# Patient Record
Sex: Female | Born: 1955 | Race: White | Hispanic: No | Marital: Married | Smoking: Never smoker
Health system: Southern US, Community
[De-identification: ages and names within clinical notes are randomized; demographics above are authoritative.]

## PROBLEM LIST (undated history)

## (undated) DIAGNOSIS — D649 Anemia, unspecified: Secondary | ICD-10-CM

## (undated) DIAGNOSIS — C4491 Basal cell carcinoma of skin, unspecified: Secondary | ICD-10-CM

## (undated) HISTORY — DX: Anemia, unspecified: D64.9

## (undated) HISTORY — DX: Basal cell carcinoma of skin, unspecified: C44.91

---

## 2004-09-19 ENCOUNTER — Ambulatory Visit: Payer: Self-pay | Admitting: Unknown Physician Specialty

## 2005-10-03 ENCOUNTER — Ambulatory Visit: Payer: Self-pay | Admitting: Unknown Physician Specialty

## 2006-01-23 ENCOUNTER — Ambulatory Visit: Payer: Self-pay | Admitting: Gastroenterology

## 2006-10-06 ENCOUNTER — Ambulatory Visit: Payer: Self-pay | Admitting: Unknown Physician Specialty

## 2007-10-08 ENCOUNTER — Ambulatory Visit: Payer: Self-pay | Admitting: Unknown Physician Specialty

## 2008-10-10 ENCOUNTER — Ambulatory Visit: Payer: Self-pay | Admitting: Unknown Physician Specialty

## 2009-10-16 ENCOUNTER — Ambulatory Visit: Payer: Self-pay | Admitting: Unknown Physician Specialty

## 2010-09-24 ENCOUNTER — Ambulatory Visit: Payer: Self-pay | Admitting: Unknown Physician Specialty

## 2010-10-24 ENCOUNTER — Ambulatory Visit: Payer: Self-pay | Admitting: Unknown Physician Specialty

## 2012-03-24 DIAGNOSIS — C4491 Basal cell carcinoma of skin, unspecified: Secondary | ICD-10-CM

## 2012-03-24 HISTORY — DX: Basal cell carcinoma of skin, unspecified: C44.91

## 2012-03-24 HISTORY — PX: BASAL CELL CARCINOMA EXCISION: SHX1214

## 2013-03-05 ENCOUNTER — Emergency Department: Payer: Self-pay | Admitting: Internal Medicine

## 2016-03-10 ENCOUNTER — Other Ambulatory Visit: Payer: Self-pay | Admitting: Obstetrics and Gynecology

## 2016-03-10 DIAGNOSIS — Z1231 Encounter for screening mammogram for malignant neoplasm of breast: Secondary | ICD-10-CM

## 2016-04-11 ENCOUNTER — Ambulatory Visit: Payer: Self-pay

## 2016-05-02 ENCOUNTER — Ambulatory Visit
Admission: RE | Admit: 2016-05-02 | Discharge: 2016-05-02 | Disposition: A | Discharge status: Home / Self Care | Payer: BC Managed Care – PPO | Source: Ambulatory Visit | Attending: Obstetrics and Gynecology | Admitting: Obstetrics and Gynecology

## 2016-05-02 ENCOUNTER — Encounter (HOSPITAL_COMMUNITY): Payer: Self-pay

## 2016-05-02 DIAGNOSIS — Z1231 Encounter for screening mammogram for malignant neoplasm of breast: Secondary | ICD-10-CM | POA: Insufficient documentation

## 2016-05-08 ENCOUNTER — Other Ambulatory Visit: Payer: Self-pay | Admitting: *Deleted

## 2016-05-08 ENCOUNTER — Inpatient Hospital Stay
Admission: RE | Admit: 2016-05-08 | Discharge: 2016-05-08 | Disposition: A | Discharge status: Home / Self Care | Payer: Self-pay | Source: Ambulatory Visit | Attending: *Deleted | Admitting: *Deleted

## 2016-05-08 DIAGNOSIS — Z9289 Personal history of other medical treatment: Secondary | ICD-10-CM

## 2017-03-11 ENCOUNTER — Ambulatory Visit (INDEPENDENT_AMBULATORY_CARE_PROVIDER_SITE_OTHER): Payer: BC Managed Care – PPO | Admitting: Obstetrics and Gynecology

## 2017-03-11 ENCOUNTER — Encounter: Payer: Self-pay | Admitting: Obstetrics and Gynecology

## 2017-03-11 VITALS — BP 120/80 | HR 57 | Ht 67.5 in | Wt 143.0 lb

## 2017-03-11 DIAGNOSIS — Z01419 Encounter for gynecological examination (general) (routine) without abnormal findings: Secondary | ICD-10-CM | POA: Diagnosis not present

## 2017-03-11 DIAGNOSIS — Z1231 Encounter for screening mammogram for malignant neoplasm of breast: Secondary | ICD-10-CM

## 2017-03-11 DIAGNOSIS — Z1239 Encounter for other screening for malignant neoplasm of breast: Secondary | ICD-10-CM

## 2017-03-11 NOTE — Patient Instructions (Addendum)
I value your feedback and entrusting us with your care. If you get a  patient survey, I would appreciate you taking the time to let us know about your experience today. Thank you!  [facility]: [phone]    

## 2017-03-11 NOTE — Progress Notes (Signed)
PCP: Patient, No Pcp Per   Chief Complaint  Patient presents with  . Gynecologic Exam    HPI:      [name] is a 61 y.o. No obstetric history on file. who LMP was No LMP recorded. Patient is postmenopausal., presents today for her annual examination.  Her menses are absent due to menopause. She does not have intermenstrual bleeding.  She does not have vasomotor sx.  Sex activity: single partner, contraception - post menopausal status. She does have vaginal dryness and uses lubricants with sx relief.  Last Pap: February 09, 2015  Results were: no abnormalities /neg HPV DNA.  Hx of STDs: none  Last mammogram: May 02, 2016  Results were: normal--routine follow-up in 12 months There is no FH of breast cancer. There is no FH of ovarian cancer. The patient does do self-breast exams.  Colonoscopy: colonoscopy 1 years ago with abnormalities. . Repeat due after 5 years.   Tobacco use: The patient denies current or previous tobacco use. Alcohol use: none Exercise: moderately active  She does get adequate calcium and Vitamin D in her diet.  Labs with PCP.  Past Medical History:  Diagnosis Date  . Basal cell carcinoma 2014   scalp    Past Surgical History:  Procedure Laterality Date  . BASAL CELL CARCINOMA EXCISION  2014   scalp    Family History  Problem Relation Age of Onset  . Breast cancer Neg Hx     Social History   Socioeconomic History  . Marital status: Married    Spouse name: Not on file  . Number of children: Not on file  . Years of education: Not on file  . Highest education level: Not on file  Social Needs  . Financial resource strain: Not on file  . Food insecurity - worry: Not on file  . Food insecurity - inability: Not on file  . Transportation needs - medical: Not on file  . Transportation needs - non-medical: Not on file  Occupational History  . Not on file  Tobacco Use  . Smoking status: Never Smoker  . Smokeless tobacco: Never  Used  Substance and Sexual Activity  . Alcohol use: No    Frequency: Never  . Drug use: No  . Sexual activity: Yes  Other Topics Concern  . Not on file  Social History Narrative  . Not on file    No outpatient medications have been marked as taking for the 03/11/17 encounter (Office Visit) with Vonette Grosso, Deirdre Evener, PA-C.      ROS:  Review of Systems  Constitutional: Negative for fatigue, fever and unexpected weight change.  Respiratory: Positive for cough. Negative for shortness of breath and wheezing.   Cardiovascular: Negative for chest pain, palpitations and leg swelling.  Gastrointestinal: Negative for blood in stool, constipation, diarrhea, nausea and vomiting.  Endocrine: Negative for cold intolerance, heat intolerance and polyuria.  Genitourinary: Negative for dyspareunia, dysuria, flank pain, frequency, genital sores, hematuria, menstrual problem, pelvic pain, urgency, vaginal bleeding, vaginal discharge and vaginal pain.  Musculoskeletal: Negative for back pain, joint swelling and myalgias.  Skin: Negative for rash.  Neurological: Negative for dizziness, syncope, light-headedness, numbness and headaches.  Hematological: Negative for adenopathy.  Psychiatric/Behavioral: Negative for agitation, confusion, sleep disturbance and suicidal ideas. The patient is not nervous/anxious.      Objective: BP 120/80   Pulse (!) 57   Ht 5' 7.5" (1.715 m)   Wt 143 lb (64.9 kg)   BMI  22.07 kg/m    Physical Exam  Constitutional: She is oriented to person, place, and time. She appears well-developed and well-nourished.  Genitourinary: Vagina normal and uterus normal. There is no rash or tenderness on the right labia. There is no rash or tenderness on the left labia. No erythema or tenderness in the vagina. No vaginal discharge found. Right adnexum does not display mass and does not display tenderness. Left adnexum does not display mass and does not display tenderness. Cervix does not  exhibit motion tenderness or polyp. Uterus is not enlarged or tender.  Neck: Normal range of motion. No thyromegaly present.  Cardiovascular: Normal rate, regular rhythm and normal heart sounds.  No murmur heard. Pulmonary/Chest: Effort normal and breath sounds normal. Right breast exhibits no mass, no nipple discharge, no skin change and no tenderness. Left breast exhibits no mass, no nipple discharge, no skin change and no tenderness.  Abdominal: Soft. There is no tenderness. There is no guarding.  Musculoskeletal: Normal range of motion.  Neurological: She is alert and oriented to person, place, and time. No cranial nerve deficit.  Psychiatric: She has a normal mood and affect. Her behavior is normal.  Vitals reviewed.   Assessment/Plan:  Encounter for annual routine gynecological examination  Screening for breast cancer - Pt to sched mammo. - Plan: MM DIGITAL SCREENING BILATERAL          GYN counsel breast self exam, mammography screening, menopause, adequate intake of calcium and vitamin D, diet and exercise    F/U  Return in about 1 year (around 03/11/2018).  Orlandria Kissner B. Tylicia Sherman, PA-C 03/11/2017 9:24 AM

## 2017-05-05 ENCOUNTER — Ambulatory Visit
Admission: RE | Admit: 2017-05-05 | Discharge: 2017-05-05 | Disposition: A | Discharge status: Home / Self Care | Payer: BC Managed Care – PPO | Source: Ambulatory Visit | Attending: Obstetrics and Gynecology | Admitting: Obstetrics and Gynecology

## 2017-05-05 ENCOUNTER — Encounter: Payer: Self-pay | Admitting: Obstetrics and Gynecology

## 2017-05-05 DIAGNOSIS — Z1239 Encounter for other screening for malignant neoplasm of breast: Secondary | ICD-10-CM

## 2017-05-05 DIAGNOSIS — Z1231 Encounter for screening mammogram for malignant neoplasm of breast: Secondary | ICD-10-CM | POA: Insufficient documentation

## 2018-03-29 NOTE — Patient Instructions (Addendum)
I value your feedback and entrusting us with your care. If you get a [facility] patient survey, I would appreciate you taking the time to let us know about your experience today. Thank you!  [facility]: [phone]    

## 2018-03-29 NOTE — Progress Notes (Signed)
PCP: Patient, No Pcp Per   Chief Complaint  Patient presents with  . Gynecologic Exam    HPI:      [name] is a 63 y.o. No obstetric history on file. who LMP was No LMP recorded. Patient is postmenopausal., presents today for her annual examination.  Her menses are absent due to menopause. She does not have intermenstrual bleeding.  She does not have vasomotor sx.  Sex activity: single partner, contraception - post menopausal status. She does have vaginal dryness and uses lubricants with sx relief.  Last Pap: February 09, 2015  Results were: no abnormalities /neg HPV DNA.  Hx of STDs: none  Last mammogram: 05/05/17  Results were: normal--routine follow-up in 12 months There is no FH of breast cancer. There is no FH of ovarian cancer. The patient does do self-breast exams.  Colonoscopy: colonoscopy 2 years ago with abnormalities.  Repeat due after 5 years. FH colon cancer but doesn't qualify for cancer genetic testing.   Tobacco use: The patient denies current or previous tobacco use. Alcohol use: none Exercise: moderately active  She does get adequate calcium and Vitamin D in her diet.  Labs with PCP.  Past Medical History:  Diagnosis Date  . Anemia   . Basal cell carcinoma 2014   scalp    Past Surgical History:  Procedure Laterality Date  . BASAL CELL CARCINOMA EXCISION  2014   scalp  . CESAREAN SECTION  08/20/1983    Family History  Problem Relation Age of Onset  . Skin cancer Mother   . Leukemia Father   . Colon cancer Maternal Grandfather        "older"  . Colon cancer Maternal Aunt        92s  . Breast cancer Neg Hx     Social History   Socioeconomic History  . Marital status: Married    Spouse name: Not on file  . Number of children: Not on file  . Years of education: Not on file  . Highest education level: Not on file  Occupational History  . Not on file  Social Needs  . Financial resource strain: Not on file  . Food insecurity:     Worry: Not on file    Inability: Not on file  . Transportation needs:    Medical: Not on file    Non-medical: Not on file  Tobacco Use  . Smoking status: Never Smoker  . Smokeless tobacco: Never Used  Substance and Sexual Activity  . Alcohol use: No    Frequency: Never  . Drug use: No  . Sexual activity: Yes    Birth control/protection: Post-menopausal  Lifestyle  . Physical activity:    Days per week: Not on file    Minutes per session: Not on file  . Stress: Not on file  Relationships  . Social connections:    Talks on phone: Not on file    Gets together: Not on file    Attends religious service: Not on file    Active member of club or organization: Not on file    Attends meetings of clubs or organizations: Not on file    Relationship status: Not on file  . Intimate partner violence:    Fear of current or ex partner: Not on file    Emotionally abused: Not on file    Physically abused: Not on file    Forced sexual activity: Not on file  Other Topics Concern  .  Not on file  Social History Narrative  . Not on file    Current Meds  Medication Sig  . calcium carbonate (CALCIUM 600) 600 MG TABS tablet Take by mouth.  Marland Kitchen ibuprofen (ADVIL,MOTRIN) 200 MG tablet Take by mouth.  . Omega-3 Fatty Acids (FISH OIL) 1200 MG CAPS Take by mouth.      ROS:  Review of Systems  Constitutional: Negative for fatigue, fever and unexpected weight change.  Respiratory: Negative for cough, shortness of breath and wheezing.   Cardiovascular: Negative for chest pain, palpitations and leg swelling.  Gastrointestinal: Negative for blood in stool, constipation, diarrhea, nausea and vomiting.  Endocrine: Negative for cold intolerance, heat intolerance and polyuria.  Genitourinary: Negative for dyspareunia, dysuria, flank pain, frequency, genital sores, hematuria, menstrual problem, pelvic pain, urgency, vaginal bleeding, vaginal discharge and vaginal pain.  Musculoskeletal: Negative for  back pain, joint swelling and myalgias.  Skin: Negative for rash.  Neurological: Positive for numbness. Negative for dizziness, syncope, light-headedness and headaches.  Hematological: Negative for adenopathy.  Psychiatric/Behavioral: Negative for agitation, confusion, sleep disturbance and suicidal ideas. The patient is not nervous/anxious.      Objective: BP 116/70   Pulse 61   Ht 5\' 7"  (1.702 m)   Wt 146 lb (66.2 kg)   BMI 22.87 kg/m    Physical Exam Constitutional:      Appearance: She is well-developed.  Genitourinary:     Vulva, vagina, uterus, right adnexa and left adnexa normal.     No vulval lesion or tenderness noted.     No vaginal discharge, erythema or tenderness.     No cervical motion tenderness or polyp.     Uterus is not enlarged or tender.     No right or left adnexal mass present.     Right adnexa not tender.     Left adnexa not tender.  Neck:     Musculoskeletal: Normal range of motion.     Thyroid: No thyromegaly.  Cardiovascular:     Rate and Rhythm: Normal rate and regular rhythm.     Heart sounds: Normal heart sounds. No murmur.  Pulmonary:     Effort: Pulmonary effort is normal.     Breath sounds: Normal breath sounds.  Chest:     Breasts:        Right: No mass, nipple discharge, skin change or tenderness.        Left: No mass, nipple discharge, skin change or tenderness.  Abdominal:     Palpations: Abdomen is soft.     Tenderness: There is no abdominal tenderness. There is no guarding.  Musculoskeletal: Normal range of motion.  Neurological:     Mental Status: She is alert and oriented to person, place, and time.     Cranial Nerves: No cranial nerve deficit.  Psychiatric:        Behavior: Behavior normal.  Vitals signs reviewed.     Assessment/Plan:  Encounter for annual routine gynecological examination  Cervical cancer screening - Plan: Cytology - PAP  Screening for HPV (human papillomavirus) - Plan: Cytology - PAP  Screening  for breast cancer - Pt to sched mammo - Plan: MM 3D SCREEN BREAST BILATERAL          GYN counsel breast self exam, mammography screening, menopause, adequate intake of calcium and vitamin D, diet and exercise    F/U  Return in about 1 year (around 03/31/2019).  Breanna Shorkey B. Deloros Beretta, PA-C 03/30/2018 8:46 AM

## 2018-03-30 ENCOUNTER — Encounter: Payer: Self-pay | Admitting: Obstetrics and Gynecology

## 2018-03-30 ENCOUNTER — Ambulatory Visit (INDEPENDENT_AMBULATORY_CARE_PROVIDER_SITE_OTHER): Payer: BC Managed Care – PPO | Admitting: Obstetrics and Gynecology

## 2018-03-30 ENCOUNTER — Other Ambulatory Visit (HOSPITAL_COMMUNITY)
Admission: RE | Admit: 2018-03-30 | Discharge: 2018-03-30 | Disposition: A | Discharge status: Home / Self Care | Payer: BC Managed Care – PPO | Source: Ambulatory Visit | Attending: Obstetrics and Gynecology | Admitting: Obstetrics and Gynecology

## 2018-03-30 VITALS — BP 116/70 | HR 61 | Ht 67.0 in | Wt 146.0 lb

## 2018-03-30 DIAGNOSIS — Z01419 Encounter for gynecological examination (general) (routine) without abnormal findings: Secondary | ICD-10-CM

## 2018-03-30 DIAGNOSIS — Z124 Encounter for screening for malignant neoplasm of cervix: Secondary | ICD-10-CM | POA: Insufficient documentation

## 2018-03-30 DIAGNOSIS — Z1151 Encounter for screening for human papillomavirus (HPV): Secondary | ICD-10-CM | POA: Insufficient documentation

## 2018-03-30 DIAGNOSIS — Z1239 Encounter for other screening for malignant neoplasm of breast: Secondary | ICD-10-CM

## 2018-03-31 LAB — CYTOLOGY - PAP
DIAGNOSIS: NEGATIVE
HPV: NOT DETECTED

## 2018-05-06 ENCOUNTER — Ambulatory Visit
Admission: RE | Admit: 2018-05-06 | Discharge: 2018-05-06 | Disposition: A | Discharge status: Home / Self Care | Payer: BC Managed Care – PPO | Source: Ambulatory Visit | Attending: Obstetrics and Gynecology | Admitting: Obstetrics and Gynecology

## 2018-05-06 ENCOUNTER — Encounter: Payer: Self-pay | Admitting: Obstetrics and Gynecology

## 2018-05-06 DIAGNOSIS — Z1239 Encounter for other screening for malignant neoplasm of breast: Secondary | ICD-10-CM

## 2018-05-06 DIAGNOSIS — Z1231 Encounter for screening mammogram for malignant neoplasm of breast: Secondary | ICD-10-CM | POA: Insufficient documentation

## 2018-09-21 NOTE — Patient Instructions (Signed)
I value your feedback and entrusting us with your care. If you get a [facility] patient survey, I would appreciate you taking the time to let us know about your experience today. Thank you! 

## 2018-09-21 NOTE — Progress Notes (Signed)
Copland, Deirdre Evener, PA-C   Chief Complaint  Patient presents with  . Breast Exam    sore to the touch on side of right breast    HPI:      Ms. FRONA YOST is a 63 y.o. G1P1001 who LMP was No LMP recorded. Patient is postmenopausal., presents today for RT breast pain since end of May after mild shoulder injury. Pt was pulling box down and it was heavier than she expected. It jarred her RT shoulder and since then she has had RT shoulder, neck, back, and lat pain/aches. Saw PCP, started on meloxicam with some improvement. Pt has full ROM of RT arm and denies weakness/numbness, but RT arm sometimes feels heavy. With this, she has noted RT OQ breast tenderness with palpation, no masses. Also s/p RT breast/axilla injury 12/19 after falling on chair that hit breast tissue. That injury resolved.  Just wants peace of mind that it's related to shoulder. Drinks caffeine daily. Pt is postmenopausal. Last mammo 2/20 was normal. No FH breast/ovar cancer.  Last annual 1/20.  Past Medical History:  Diagnosis Date  . Anemia   . Basal cell carcinoma 2014   scalp    Past Surgical History:  Procedure Laterality Date  . BASAL CELL CARCINOMA EXCISION  2014   scalp  . CESAREAN SECTION  08/20/1983    Family History  Problem Relation Age of Onset  . Skin cancer Mother   . Leukemia Father   . Colon cancer Maternal Grandfather        "older"  . Colon cancer Maternal Aunt        45s  . Breast cancer Neg Hx     Social History   Socioeconomic History  . Marital status: Married    Spouse name: Not on file  . Number of children: Not on file  . Years of education: Not on file  . Highest education level: Not on file  Occupational History  . Not on file  Social Needs  . Financial resource strain: Not on file  . Food insecurity    Worry: Not on file    Inability: Not on file  . Transportation needs    Medical: Not on file    Non-medical: Not on file  Tobacco Use  . Smoking status:  Never Smoker  . Smokeless tobacco: Never Used  Substance and Sexual Activity  . Alcohol use: No    Frequency: Never  . Drug use: No  . Sexual activity: Yes    Birth control/protection: Post-menopausal  Lifestyle  . Physical activity    Days per week: Not on file    Minutes per session: Not on file  . Stress: Not on file  Relationships  . Social Herbalist on phone: Not on file    Gets together: Not on file    Attends religious service: Not on file    Active member of club or organization: Not on file    Attends meetings of clubs or organizations: Not on file    Relationship status: Not on file  . Intimate partner violence    Fear of current or ex partner: Not on file    Emotionally abused: Not on file    Physically abused: Not on file    Forced sexual activity: Not on file  Other Topics Concern  . Not on file  Social History Narrative  . Not on file    Outpatient Medications Prior to Visit  Medication Sig Dispense Refill  . calcium carbonate (CALCIUM 600) 600 MG TABS tablet Take by mouth.    Marland Kitchen ibuprofen (ADVIL,MOTRIN) 200 MG tablet Take by mouth.    . Omega-3 Fatty Acids (FISH OIL) 1200 MG CAPS Take by mouth.    . meloxicam (MOBIC) 15 MG tablet Take by mouth.     No facility-administered medications prior to visit.       ROS:  Review of Systems  Constitutional: Negative for fatigue, fever and unexpected weight change.  Respiratory: Negative for cough, shortness of breath and wheezing.   Cardiovascular: Negative for chest pain, palpitations and leg swelling.  Gastrointestinal: Negative for blood in stool, constipation, diarrhea, nausea and vomiting.  Endocrine: Negative for cold intolerance, heat intolerance and polyuria.  Genitourinary: Negative for dyspareunia, dysuria, flank pain, frequency, genital sores, hematuria, menstrual problem, pelvic pain, urgency, vaginal bleeding, vaginal discharge and vaginal pain.  Musculoskeletal: Positive for  arthralgias. Negative for back pain, joint swelling and myalgias.  Skin: Negative for rash.  Neurological: Negative for dizziness, syncope, light-headedness, numbness and headaches.  Hematological: Negative for adenopathy.  Psychiatric/Behavioral: Negative for agitation, confusion, sleep disturbance and suicidal ideas. The patient is not nervous/anxious.    BREAST: PAIN   OBJECTIVE:   Vitals:  BP 140/80   Ht 5\' 7"  (1.702 m)   Wt 146 lb (66.2 kg)   BMI 22.87 kg/m   Physical Exam Vitals signs reviewed.  Neck:     Musculoskeletal: Normal range of motion.  Pulmonary:     Effort: Pulmonary effort is normal.  Chest:     Breasts: Breasts are symmetrical.        Right: No inverted nipple, mass, nipple discharge, skin change or tenderness.        Left: No inverted nipple, mass, nipple discharge, skin change or tenderness.    Musculoskeletal: Normal range of motion.  Skin:    General: Skin is warm and dry.  Neurological:     General: No focal deficit present.     Mental Status: She is alert and oriented to person, place, and time.     Cranial Nerves: No cranial nerve deficit.  Psychiatric:        Mood and Affect: Mood normal.        Behavior: Behavior normal.        Thought Content: Thought content normal.        Judgment: Judgment normal.     Assessment/Plan: Breast pain, right - Plan: Most likely pectoral muscle pain from shoulder injury. Reassurance. Cont SBE, d/c caffeine. F/u prn.   Injury of right shoulder, initial encounter - Plan: Cont exercise/stretch/NSAIDs/heat and ice. F/u prn.      Return if symptoms worsen or fail to improve.  Alicia B. Copland, PA-C 09/22/2018 9:52 AM

## 2018-09-22 ENCOUNTER — Encounter: Payer: Self-pay | Admitting: Obstetrics and Gynecology

## 2018-09-22 ENCOUNTER — Other Ambulatory Visit: Payer: Self-pay

## 2018-09-22 ENCOUNTER — Ambulatory Visit (INDEPENDENT_AMBULATORY_CARE_PROVIDER_SITE_OTHER): Payer: BC Managed Care – PPO | Admitting: Obstetrics and Gynecology

## 2018-09-22 VITALS — BP 140/80 | Ht 67.0 in | Wt 146.0 lb

## 2018-09-22 DIAGNOSIS — S4991XA Unspecified injury of right shoulder and upper arm, initial encounter: Secondary | ICD-10-CM

## 2018-09-22 DIAGNOSIS — N644 Mastodynia: Secondary | ICD-10-CM | POA: Diagnosis not present

## 2019-04-20 NOTE — Progress Notes (Signed)
PCP: Chad Cordial, PA-C   Chief Complaint  Patient presents with  . Gynecologic Exam    still having tenderness under right arm (side of right breast)    HPI:      [name] is a 64 y.o. No obstetric history on file. who LMP was No LMP recorded. Patient is postmenopausal., presents today for her annual examination.  Her menses are absent due to menopause. She does not have intermenstrual bleeding. She does not have vasomotor sx.   Sex activity: single partner, contraception - post menopausal status. She does have vaginal dryness and uses lubricants with sx relief.  Last Pap: 03/30/18  Results were: no abnormalities /neg HPV DNA.  Hx of STDs: none  Last mammogram: 05/06/18  Results were: normal--routine follow-up in 12 months There is no FH of breast cancer. There is no FH of ovarian cancer. The patient does do self-breast exams. She was seen 7/20 for ROQ breast and axilla pain after shoulder injury. Had also fallen 12/19 and hit side of breast. Pt was given meloxicam by PCP. Still having pain sx, no masses. Concerned about breast pathology. Has some pain with certain shoulder movements as well. PCP suggested PT.   Colonoscopy: colonoscopy 3 years ago with abnormalities.  Repeat due after 5 years. FH colon cancer but doesn't qualify for cancer genetic testing.   Tobacco use: The patient denies current or previous tobacco use. Alcohol use: none  No drug use Exercise: moderately active  She does get adequate calcium and Vitamin D in her diet.  Labs with PCP.  Past Medical History:  Diagnosis Date  . Anemia   . Basal cell carcinoma 2014   scalp    Past Surgical History:  Procedure Laterality Date  . BASAL CELL CARCINOMA EXCISION  2014   scalp  . CESAREAN SECTION  08/20/1983    Family History  Problem Relation Age of Onset  . Skin cancer Mother   . Leukemia Father   . Colon cancer Maternal Grandfather        "older"  . Colon cancer Maternal Aunt    59s  . Breast cancer Neg Hx     Social History   Socioeconomic History  . Marital status: Married    Spouse name: Not on file  . Number of children: Not on file  . Years of education: Not on file  . Highest education level: Not on file  Occupational History  . Not on file  Tobacco Use  . Smoking status: Never Smoker  . Smokeless tobacco: Never Used  Substance and Sexual Activity  . Alcohol use: No  . Drug use: No  . Sexual activity: Yes    Birth control/protection: Post-menopausal  Other Topics Concern  . Not on file  Social History Narrative  . Not on file   Social Determinants of Health   Financial Resource Strain:   . Difficulty of Paying Living Expenses: Not on file  Food Insecurity:   . Worried About Charity fundraiser in the Last Year: Not on file  . Ran Out of Food in the Last Year: Not on file  Transportation Needs:   . Lack of Transportation (Medical): Not on file  . Lack of Transportation (Non-Medical): Not on file  Physical Activity:   . Days of Exercise per Week: Not on file  . Minutes of Exercise per Session: Not on file  Stress:   . Feeling of Stress : Not on file  Social Connections:   .  Frequency of Communication with Friends and Family: Not on file  . Frequency of Social Gatherings with Friends and Family: Not on file  . Attends Religious Services: Not on file  . Active Member of Clubs or Organizations: Not on file  . Attends Archivist Meetings: Not on file  . Marital Status: Not on file  Intimate Partner Violence:   . Fear of Current or Ex-Partner: Not on file  . Emotionally Abused: Not on file  . Physically Abused: Not on file  . Sexually Abused: Not on file    Current Meds  Medication Sig  . Calcium Carbonate-Vitamin D 600-400 MG-UNIT tablet Take by mouth.  Marland Kitchen ibuprofen (ADVIL,MOTRIN) 200 MG tablet Take by mouth.  . Omega-3 Fatty Acids (FISH OIL) 1200 MG CAPS Take by mouth.      ROS:  Review of Systems  Constitutional:  Negative for fatigue, fever and unexpected weight change.  Respiratory: Negative for cough, shortness of breath and wheezing.   Cardiovascular: Negative for chest pain, palpitations and leg swelling.  Gastrointestinal: Negative for blood in stool, constipation, diarrhea, nausea and vomiting.  Endocrine: Negative for cold intolerance, heat intolerance and polyuria.  Genitourinary: Negative for dyspareunia, dysuria, flank pain, frequency, genital sores, hematuria, menstrual problem, pelvic pain, urgency, vaginal bleeding, vaginal discharge and vaginal pain.  Musculoskeletal: Negative for back pain, joint swelling and myalgias.  Skin: Negative for rash.  Neurological: Negative for dizziness, syncope, light-headedness, numbness and headaches.  Hematological: Negative for adenopathy.  Psychiatric/Behavioral: Negative for agitation, confusion, sleep disturbance and suicidal ideas. The patient is not nervous/anxious.   BREAST: tenderness   Objective: BP 130/80   Ht 5\' 7"  (1.702 m)   Wt 145 lb (65.8 kg)   BMI 22.71 kg/m    Physical Exam Constitutional:      Appearance: She is well-developed.  Genitourinary:     Vulva, vagina, uterus, right adnexa and left adnexa normal.     No vulval lesion or tenderness noted.     No vaginal discharge, erythema or tenderness.     No cervical motion tenderness or polyp.     Uterus is not enlarged or tender.     No right or left adnexal mass present.     Right adnexa not tender.     Left adnexa not tender.  Neck:     Thyroid: No thyromegaly.  Cardiovascular:     Rate and Rhythm: Normal rate and regular rhythm.     Heart sounds: Normal heart sounds. No murmur.  Pulmonary:     Effort: Pulmonary effort is normal.     Breath sounds: Normal breath sounds.  Chest:     Breasts:        Right: No mass, nipple discharge, skin change or tenderness.        Left: No mass, nipple discharge, skin change or tenderness.  Abdominal:     Palpations: Abdomen is  soft.     Tenderness: There is no abdominal tenderness. There is no guarding.  Musculoskeletal:        General: Normal range of motion.     Cervical back: Normal range of motion.  Neurological:     General: No focal deficit present.     Mental Status: She is alert and oriented to person, place, and time.     Cranial Nerves: No cranial nerve deficit.  Skin:    General: Skin is warm and dry.  Psychiatric:        Mood and Affect: Mood normal.  Behavior: Behavior normal.        Thought Content: Thought content normal.        Judgment: Judgment normal.  Vitals reviewed.     Assessment/Plan:  Encounter for annual routine gynecological examination  Encounter for screening mammogram for malignant neoplasm of breast - Plan: MM 3D SCREEN BREAST BILATERAL; pt to sched mammo  Breast pain, right--neg exam. Most likely due to shoulder injury. Suggested PT, pt will do ref with PCP  Injury of right shoulder, subsequent encounter          GYN counsel breast self exam, mammography screening, menopause, adequate intake of calcium and vitamin D, diet and exercise    F/U  Return in about 1 year (around 04/20/2020).  Merlin Ege B. Danaja Lasota, PA-C 04/21/2019 8:34 AM

## 2019-04-20 NOTE — Patient Instructions (Addendum)
I value your feedback and entrusting us with your care. If you get a [facility] patient survey, I would appreciate you taking the time to let us know about your experience today. Thank you! ° °As of March 03, 2019, your lab results will be released to your MyChart immediately, before I even have a chance to see them. Please give me time to review them and contact you if there are any abnormalities. Thank you for your patience.  ° °[facility]: [phone] ° ° ° °

## 2019-04-21 ENCOUNTER — Other Ambulatory Visit: Payer: Self-pay

## 2019-04-21 ENCOUNTER — Encounter: Payer: Self-pay | Admitting: Obstetrics and Gynecology

## 2019-04-21 ENCOUNTER — Ambulatory Visit (INDEPENDENT_AMBULATORY_CARE_PROVIDER_SITE_OTHER): Payer: BC Managed Care – PPO | Admitting: Obstetrics and Gynecology

## 2019-04-21 VITALS — BP 130/80 | Ht 67.0 in | Wt 145.0 lb

## 2019-04-21 DIAGNOSIS — Z1231 Encounter for screening mammogram for malignant neoplasm of breast: Secondary | ICD-10-CM

## 2019-04-21 DIAGNOSIS — S4991XD Unspecified injury of right shoulder and upper arm, subsequent encounter: Secondary | ICD-10-CM

## 2019-04-21 DIAGNOSIS — Z01419 Encounter for gynecological examination (general) (routine) without abnormal findings: Secondary | ICD-10-CM

## 2019-04-21 DIAGNOSIS — N644 Mastodynia: Secondary | ICD-10-CM

## 2019-05-26 ENCOUNTER — Encounter: Payer: Self-pay | Admitting: Obstetrics and Gynecology

## 2019-05-26 ENCOUNTER — Ambulatory Visit
Admission: RE | Admit: 2019-05-26 | Discharge: 2019-05-26 | Disposition: A | Discharge status: Home / Self Care | Payer: BC Managed Care – PPO | Source: Ambulatory Visit | Attending: Obstetrics and Gynecology | Admitting: Obstetrics and Gynecology

## 2019-05-26 DIAGNOSIS — Z1231 Encounter for screening mammogram for malignant neoplasm of breast: Secondary | ICD-10-CM | POA: Diagnosis present

## 2020-04-19 NOTE — Patient Instructions (Addendum)
I value your feedback and you entrusting us with your care. If you get a [facility] patient survey, I would appreciate you taking the time to let us know about your experience today. Thank you!  [facility]: [phone]      

## 2020-04-19 NOTE — Progress Notes (Signed)
PCP: Chad Cordial, PA-C   Chief Complaint  Patient presents with  . Gynecologic Exam    No concerns    HPI:      [name] is a 65 y.o. No obstetric history on file. who LMP was No LMP recorded. Patient is postmenopausal., presents today for her MEDICARE annual examination.  Her menses are absent due to menopause. She does not have PMB. She does not have vasomotor sx.   Sex activity: single partner, contraception - post menopausal status. She does have vaginal dryness and uses lubricants with sx relief.  Last Pap: 03/30/18  Results were: no abnormalities /neg HPV DNA.  Hx of STDs: none  Last mammogram: 05/26/19  Results were: normal--routine follow-up in 12 months There is no FH of breast cancer. There is no FH of ovarian cancer. The patient does do self-breast exams.   Colonoscopy: colonoscopy 06/2016 with abnormalities.  Repeat due after 5 years. FH colon cancer but doesn't qualify for cancer genetic testing.   Tobacco use: The patient denies current or previous tobacco use. 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  Labs with PCP.  Past Medical History:  Diagnosis Date  . Anemia   . Basal cell carcinoma 2014   scalp    Past Surgical History:  Procedure Laterality Date  . BASAL CELL CARCINOMA EXCISION  2014   scalp  . CESAREAN SECTION  08/20/1983    Family History  Problem Relation Age of Onset  . Skin cancer Mother   . Leukemia Father   . Colon cancer Maternal Grandfather        "older"  . Colon cancer Maternal Aunt        47s  . Breast cancer Neg Hx     Social History   Socioeconomic History  . Marital status: Married    Spouse name: Not on file  . Number of children: Not on file  . Years of education: Not on file  . Highest education level: Not on file  Occupational History  . Not on  file  Tobacco Use  . Smoking status: Never Smoker  . Smokeless tobacco: Never Used  Vaping Use  . Vaping Use: Never used  Substance and Sexual Activity  . Alcohol use: No  . Drug use: No  . Sexual activity: Yes    Birth control/protection: Post-menopausal  Other Topics Concern  . Not on file  Social History Narrative  . Not on file   Social Determinants of Health   Financial Resource Strain: Not on file  Food Insecurity: Not on file  Transportation Needs: Not on file  Physical Activity: Not on file  Stress: Not on file  Social Connections: Not on file  Intimate Partner Violence: Not on file    Current Meds  Medication Sig  . alendronate (FOSAMAX) 70 MG tablet Take by mouth.  . Calcium Carbonate-Vitamin D 600-400 MG-UNIT tablet Take by mouth.  Marland Kitchen ibuprofen (ADVIL,MOTRIN) 200 MG tablet Take by mouth.  . Omega-3 Fatty Acids (FISH OIL) 1200 MG CAPS Take by mouth.      ROS:  Review of Systems  Constitutional: Negative for fatigue, fever and unexpected weight change.  Respiratory: Negative for cough, shortness of breath and wheezing.   Cardiovascular: Negative for chest pain, palpitations and leg swelling.  Gastrointestinal: Negative for blood in  stool, constipation, diarrhea, nausea and vomiting.  Endocrine: Negative for cold intolerance, heat intolerance and polyuria.  Genitourinary: Negative for dyspareunia, dysuria, flank pain, frequency, genital sores, hematuria, menstrual problem, pelvic pain, urgency, vaginal bleeding, vaginal discharge and vaginal pain.  Musculoskeletal: Negative for back pain, joint swelling and myalgias.  Skin: Negative for rash.  Neurological: Negative for dizziness, syncope, light-headedness, numbness and headaches.  Hematological: Negative for adenopathy.  Psychiatric/Behavioral: Negative for agitation, confusion, sleep disturbance and suicidal ideas. The patient is not nervous/anxious.   BREAST: tenderness   Objective: BP 100/60   Ht 5'  7" (1.702 m)   Wt 148 lb (67.1 kg)   BMI 23.18 kg/m    Physical Exam Constitutional:      Appearance: She is well-developed.  Genitourinary:     Vulva normal.     Right Labia: No rash, tenderness or lesions.    Left Labia: No tenderness, lesions or rash.    No vaginal discharge, erythema or tenderness.      Right Adnexa: not tender and no mass present.    Left Adnexa: not tender and no mass present.    No cervical motion tenderness, friability or polyp.     Uterus is not enlarged or tender.  Breasts:     Right: No mass, nipple discharge, skin change or tenderness.     Left: No mass, nipple discharge, skin change or tenderness.    Neck:     Thyroid: No thyromegaly.  Cardiovascular:     Rate and Rhythm: Normal rate and regular rhythm.     Heart sounds: Normal heart sounds. No murmur heard.   Pulmonary:     Effort: Pulmonary effort is normal.     Breath sounds: Normal breath sounds.  Abdominal:     Palpations: Abdomen is soft.     Tenderness: There is no abdominal tenderness. There is no guarding or rebound.  Musculoskeletal:        General: Normal range of motion.     Cervical back: Normal range of motion.  Lymphadenopathy:     Cervical: No cervical adenopathy.  Neurological:     General: No focal deficit present.     Mental Status: She is alert and oriented to person, place, and time.     Cranial Nerves: No cranial nerve deficit.  Skin:    General: Skin is warm and dry.  Psychiatric:        Mood and Affect: Mood normal.        Behavior: Behavior normal.        Thought Content: Thought content normal.        Judgment: Judgment normal.  Vitals reviewed.     Assessment/Plan:  Encounter for annual routine gynecological examination  Encounter for screening mammogram for malignant neoplasm of breast - Plan: MM 3D SCREEN BREAST BILATERAL; pt to sched mammo          GYN counsel breast self exam, mammography screening, menopause, adequate intake of calcium and  vitamin D, diet and exercise    F/U  Return in about 1 year (around 04/23/2021).  Lilia Letterman B. Shirin Echeverry, PA-C 04/23/2020 8:41 AM

## 2020-04-23 ENCOUNTER — Encounter: Payer: Self-pay | Admitting: Obstetrics and Gynecology

## 2020-04-23 ENCOUNTER — Other Ambulatory Visit: Payer: Self-pay

## 2020-04-23 ENCOUNTER — Ambulatory Visit (INDEPENDENT_AMBULATORY_CARE_PROVIDER_SITE_OTHER): Payer: Medicare PPO | Admitting: Obstetrics and Gynecology

## 2020-04-23 VITALS — BP 100/60 | Ht 67.0 in | Wt 148.0 lb

## 2020-04-23 DIAGNOSIS — Z1231 Encounter for screening mammogram for malignant neoplasm of breast: Secondary | ICD-10-CM | POA: Diagnosis not present

## 2020-04-23 DIAGNOSIS — Z01419 Encounter for gynecological examination (general) (routine) without abnormal findings: Secondary | ICD-10-CM

## 2020-05-28 ENCOUNTER — Ambulatory Visit
Admission: RE | Admit: 2020-05-28 | Discharge: 2020-05-28 | Disposition: A | Discharge status: Home / Self Care | Payer: Medicare PPO | Source: Ambulatory Visit | Attending: Obstetrics and Gynecology | Admitting: Obstetrics and Gynecology

## 2020-05-28 ENCOUNTER — Other Ambulatory Visit: Payer: Self-pay

## 2020-05-28 DIAGNOSIS — Z1231 Encounter for screening mammogram for malignant neoplasm of breast: Secondary | ICD-10-CM | POA: Insufficient documentation

## 2021-05-01 NOTE — Progress Notes (Signed)
PCP: Chad Cordial, PA-C   Chief Complaint  Patient presents with   Gynecologic Exam    No concerns    HPI:      [name] is a 66 y.o. No obstetric history on file. who LMP was No LMP recorded. Patient is postmenopausal., presents today for her MEDICARE annual examination.  Her menses are absent due to menopause. She does not have PMB. She does not have vasomotor sx.   Sex activity: single partner, contraception - post menopausal status. She does have vaginal dryness and uses lubricants without sx relief. Hasn't tried coconut oil.   Last Pap: 03/30/18  Results were: no abnormalities /neg HPV DNA.  Hx of STDs: none  Last mammogram: 05/28/20 Results were: normal--routine follow-up in 12 months There is no FH of breast cancer. There is no FH of ovarian cancer. The patient does do self-breast exams.   Colonoscopy: colonoscopy 06/2016 with abnormalities.  Repeat due after 5 years. FH colon cancer but doesn't qualify for cancer genetic testing. Getting ref through PCP.  Tobacco use: The patient denies current or previous tobacco use. 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  She does get adequate calcium and Vitamin D in her diet.  Labs with PCP.  Past Medical History:  Diagnosis Date   Anemia    Basal cell carcinoma 2014   scalp    Past Surgical History:  Procedure Laterality Date   BASAL CELL CARCINOMA EXCISION  2014   scalp   CESAREAN SECTION  08/20/1983    Family History  Problem Relation Age of Onset   Skin cancer Mother    Leukemia Father    Colon cancer Maternal Grandfather        "older"   Colon cancer Maternal Aunt        34s   Breast cancer Neg Hx     Social History   Socioeconomic History   Marital status: Married    Spouse name: Not on file   Number of children: Not on file   Years of education: Not on file   Highest education level: Not on  file  Occupational History   Not on file  Tobacco Use   Smoking status: Never   Smokeless tobacco: Never  Vaping Use   Vaping Use: Never used  Substance and Sexual Activity   Alcohol use: No   Drug use: No   Sexual activity: Yes    Birth control/protection: Post-menopausal  Other Topics Concern   Not on file  Social History Narrative   Not on file   Social Determinants of Health   Financial Resource Strain: Not on file  Food Insecurity: Not on file  Transportation Needs: Not on file  Physical Activity: Not on file  Stress: Not on file  Social Connections: Not on file  Intimate Partner Violence: Not on file    Current Meds  Medication Sig   alendronate (FOSAMAX) 70 MG tablet Take by mouth.   benzonatate (TESSALON) 200 MG capsule Take by mouth.   Calcium Carbonate-Vitamin D 600-400 MG-UNIT tablet Take by mouth.   cefdinir (OMNICEF) 300 MG capsule Take by mouth.   ibuprofen (ADVIL,MOTRIN) 200 MG tablet Take by mouth.   Omega-3 Fatty Acids (FISH OIL) 1200 MG CAPS Take by mouth.      ROS:  Review of Systems  Constitutional:  Negative for fatigue, fever and unexpected weight change.  Respiratory:  Negative for cough, shortness of breath and wheezing.   Cardiovascular:  Negative for chest pain, palpitations and leg swelling.  Gastrointestinal:  Negative for blood in stool, constipation, diarrhea, nausea and vomiting.  Endocrine: Negative for cold intolerance, heat intolerance and polyuria.  Genitourinary:  Negative for dyspareunia, dysuria, flank pain, frequency, genital sores, hematuria, menstrual problem, pelvic pain, urgency, vaginal bleeding, vaginal discharge and vaginal pain.  Musculoskeletal:  Negative for back pain, joint swelling and myalgias.  Skin:  Negative for rash.  Neurological:  Negative for dizziness, syncope, light-headedness, numbness and headaches.  Hematological:  Negative for adenopathy.  Psychiatric/Behavioral:  Negative for agitation, confusion,  sleep disturbance and suicidal ideas. The patient is not nervous/anxious.  BREAST: no tenderness   Objective: BP 114/70    Ht 5\' 7"  (1.702 m)    Wt 148 lb (67.1 kg)    BMI 23.18 kg/m    Physical Exam Constitutional:      Appearance: She is well-developed.  Genitourinary:     Vulva normal.     Right Labia: No rash, tenderness or lesions.    Left Labia: No tenderness, lesions or rash.    No vaginal discharge, erythema or tenderness.     Mild vaginal atrophy present.     Right Adnexa: not tender and no mass present.    Left Adnexa: not tender and no mass present.    No cervical motion tenderness, friability or polyp.     Uterus is not enlarged or tender.  Breasts:    Right: No mass, nipple discharge, skin change or tenderness.     Left: No mass, nipple discharge, skin change or tenderness.  Neck:     Thyroid: No thyromegaly.  Cardiovascular:     Rate and Rhythm: Normal rate and regular rhythm.     Heart sounds: Normal heart sounds. No murmur heard. Pulmonary:     Effort: Pulmonary effort is normal.     Breath sounds: Normal breath sounds.  Abdominal:     Palpations: Abdomen is soft.     Tenderness: There is no abdominal tenderness. There is no guarding or rebound.  Musculoskeletal:        General: Normal range of motion.     Cervical back: Normal range of motion.  Lymphadenopathy:     Cervical: No cervical adenopathy.  Neurological:     General: No focal deficit present.     Mental Status: She is alert and oriented to person, place, and time.     Cranial Nerves: No cranial nerve deficit.  Skin:    General: Skin is warm and dry.  Psychiatric:        Mood and Affect: Mood normal.        Behavior: Behavior normal.        Thought Content: Thought content normal.        Judgment: Judgment normal.  Vitals reviewed.    Assessment/Plan: Encounter for annual routine gynecological examination  Encounter for screening mammogram for malignant neoplasm of breast - Plan: MM  3D SCREEN BREAST BILATERAL; pt to scheds mammo  Screening for colon cancer--doing ref through PCP  Postmenopausal atrophic vaginitis--try coconut oil as lubricant. If still sx, will add vag ERT.           GYN counsel breast self exam, mammography screening, menopause, adequate intake of calcium and vitamin D, diet and exercise    F/U  Return in about 1 year (around 05/02/2022).  Verlin Uher B. Seraiah Nowack, PA-C 05/02/2021 10:01 AM

## 2021-05-02 ENCOUNTER — Ambulatory Visit (INDEPENDENT_AMBULATORY_CARE_PROVIDER_SITE_OTHER): Payer: Medicare PPO | Admitting: Obstetrics and Gynecology

## 2021-05-02 ENCOUNTER — Other Ambulatory Visit: Payer: Self-pay

## 2021-05-02 ENCOUNTER — Encounter: Payer: Self-pay | Admitting: Obstetrics and Gynecology

## 2021-05-02 VITALS — BP 114/70 | Ht 67.0 in | Wt 148.0 lb

## 2021-05-02 DIAGNOSIS — Z1231 Encounter for screening mammogram for malignant neoplasm of breast: Secondary | ICD-10-CM

## 2021-05-02 DIAGNOSIS — Z1211 Encounter for screening for malignant neoplasm of colon: Secondary | ICD-10-CM | POA: Diagnosis not present

## 2021-05-02 DIAGNOSIS — N952 Postmenopausal atrophic vaginitis: Secondary | ICD-10-CM

## 2021-05-02 DIAGNOSIS — Z01419 Encounter for gynecological examination (general) (routine) without abnormal findings: Secondary | ICD-10-CM | POA: Diagnosis not present

## 2021-05-02 NOTE — Patient Instructions (Signed)
I value your feedback and you entrusting us with your care. If you get a [facility] patient survey, I would appreciate you taking the time to let us know about your experience today. Thank you!  [facility]: [phone]      

## 2021-06-04 ENCOUNTER — Other Ambulatory Visit: Payer: Self-pay

## 2021-06-04 ENCOUNTER — Ambulatory Visit
Admission: RE | Admit: 2021-06-04 | Discharge: 2021-06-04 | Disposition: A | Discharge status: Home / Self Care | Payer: Medicare PPO | Source: Ambulatory Visit | Attending: Obstetrics and Gynecology | Admitting: Obstetrics and Gynecology

## 2021-06-04 DIAGNOSIS — Z1231 Encounter for screening mammogram for malignant neoplasm of breast: Secondary | ICD-10-CM | POA: Insufficient documentation

## 2022-05-12 NOTE — Progress Notes (Signed)
PCP: Chad Cordial, PA-C   No chief complaint on file.   HPI:      Sophia Alexander is a 67 y.o. No obstetric history on file. who LMP was No LMP recorded. Patient is postmenopausal., presents today for her MEDICARE annual examination.  Her menses are absent due to menopause. She does not have PMB. She does not have vasomotor sx.   Sex activity: single partner, contraception - post menopausal status. She does have vaginal dryness and uses lubricants without sx relief. Hasn't tried coconut oil.   Last Pap: 03/30/18  Results were: no abnormalities /neg HPV DNA.  Hx of STDs: none  Last mammogram: 06/04/21 Results were: normal--routine follow-up in 12 months There is no FH of breast cancer. There is no FH of ovarian cancer. The patient does do self-breast exams.   Colonoscopy: colonoscopy 8/23 at [facility];  Repeat due after 5*** years. FH colon cancer but doesn't qualify for cancer genetic testing. Getting ref through PCP.  Tobacco use: The patient denies current or previous tobacco use. Alcohol use: none  No drug use Exercise: moderately active  DEXA: 12/23 at [facility] was normal; 12/21 with osteopenia in spine/normal hip, doing actonel with PCP.  She does get adequate calcium and Vitamin D in her diet.  Labs with PCP.  Past Medical History:  Diagnosis Date   Anemia    Basal cell carcinoma 2014   scalp    Past Surgical History:  Procedure Laterality Date   BASAL CELL CARCINOMA EXCISION  2014   scalp   CESAREAN SECTION  08/20/1983    Family History  Problem Relation Age of Onset   Skin cancer Mother    Leukemia Father    Colon cancer Maternal Grandfather        "older"   Colon cancer Maternal Aunt        6s   Breast cancer Neg Hx     Social History   Socioeconomic History   Marital status: Married    Spouse name: Not on file   Number of children: Not on file   Years of education: Not on file   Highest education level: Not on file  Occupational History    Not on file  Tobacco Use   Smoking status: Never   Smokeless tobacco: Never  Vaping Use   Vaping Use: Never used  Substance and Sexual Activity   Alcohol use: No   Drug use: No   Sexual activity: Yes    Birth control/protection: Post-menopausal  Other Topics Concern   Not on file  Social History Narrative   Not on file   Social Determinants of Health   Financial Resource Strain: Not on file  Food Insecurity: Not on file  Transportation Needs: Not on file  Physical Activity: Not on file  Stress: Not on file  Social Connections: Not on file  Intimate Partner Violence: Not on file    No outpatient medications have been marked as taking for the 05/13/22 encounter (Appointment) with Colton Tassin, Deirdre Evener, PA-C.      ROS:  Review of Systems  Constitutional:  Negative for fatigue, fever and unexpected weight change.  Respiratory:  Negative for cough, shortness of breath and wheezing.   Cardiovascular:  Negative for chest pain, palpitations and leg swelling.  Gastrointestinal:  Negative for blood in stool, constipation, diarrhea, nausea and vomiting.  Endocrine: Negative for cold intolerance, heat intolerance and polyuria.  Genitourinary:  Negative for dyspareunia, dysuria, flank pain, frequency, genital  sores, hematuria, menstrual problem, pelvic pain, urgency, vaginal bleeding, vaginal discharge and vaginal pain.  Musculoskeletal:  Negative for back pain, joint swelling and myalgias.  Skin:  Negative for rash.  Neurological:  Negative for dizziness, syncope, light-headedness, numbness and headaches.  Hematological:  Negative for adenopathy.  Psychiatric/Behavioral:  Negative for agitation, confusion, sleep disturbance and suicidal ideas. The patient is not nervous/anxious.   BREAST: no tenderness   Objective: There were no vitals taken for this visit.   Physical Exam Constitutional:      Appearance: She is well-developed.  Genitourinary:     Vulva normal.     Right  Labia: No rash, tenderness or lesions.    Left Labia: No tenderness, lesions or rash.    No vaginal discharge, erythema or tenderness.     Mild vaginal atrophy present.     Right Adnexa: not tender and no mass present.    Left Adnexa: not tender and no mass present.    No cervical motion tenderness, friability or polyp.     Uterus is not enlarged or tender.  Breasts:    Right: No mass, nipple discharge, skin change or tenderness.     Left: No mass, nipple discharge, skin change or tenderness.  Neck:     Thyroid: No thyromegaly.  Cardiovascular:     Rate and Rhythm: Normal rate and regular rhythm.     Heart sounds: Normal heart sounds. No murmur heard. Pulmonary:     Effort: Pulmonary effort is normal.     Breath sounds: Normal breath sounds.  Abdominal:     Palpations: Abdomen is soft.     Tenderness: There is no abdominal tenderness. There is no guarding or rebound.  Musculoskeletal:        General: Normal range of motion.     Cervical back: Normal range of motion.  Lymphadenopathy:     Cervical: No cervical adenopathy.  Neurological:     General: No focal deficit present.     Mental Status: She is alert and oriented to person, place, and time.     Cranial Nerves: No cranial nerve deficit.  Skin:    General: Skin is warm and dry.  Psychiatric:        Mood and Affect: Mood normal.        Behavior: Behavior normal.        Thought Content: Thought content normal.        Judgment: Judgment normal.  Vitals reviewed.     Assessment/Plan: Encounter for annual routine gynecological examination  Encounter for screening mammogram for malignant neoplasm of breast - Plan: MM 3D SCREEN BREAST BILATERAL; pt to scheds mammo  Screening for colon cancer--doing ref through PCP  Postmenopausal atrophic vaginitis--try coconut oil as lubricant. If still sx, will add vag ERT.           GYN counsel breast self exam, mammography screening, menopause, adequate intake of calcium and  vitamin D, diet and exercise    F/U  No follow-ups on file.  [name], PA-C 05/12/2022 12:20 PM

## 2022-05-13 ENCOUNTER — Other Ambulatory Visit (HOSPITAL_COMMUNITY)
Admission: RE | Admit: 2022-05-13 | Discharge: 2022-05-13 | Disposition: A | Discharge status: Home / Self Care | Payer: Medicare PPO | Source: Ambulatory Visit | Attending: Obstetrics and Gynecology | Admitting: Obstetrics and Gynecology

## 2022-05-13 ENCOUNTER — Ambulatory Visit (INDEPENDENT_AMBULATORY_CARE_PROVIDER_SITE_OTHER): Payer: Medicare PPO | Admitting: Obstetrics and Gynecology

## 2022-05-13 ENCOUNTER — Encounter: Payer: Self-pay | Admitting: Obstetrics and Gynecology

## 2022-05-13 VITALS — BP 110/60 | Ht 67.0 in | Wt 153.0 lb

## 2022-05-13 DIAGNOSIS — N952 Postmenopausal atrophic vaginitis: Secondary | ICD-10-CM

## 2022-05-13 DIAGNOSIS — Z124 Encounter for screening for malignant neoplasm of cervix: Secondary | ICD-10-CM | POA: Diagnosis present

## 2022-05-13 DIAGNOSIS — Z01419 Encounter for gynecological examination (general) (routine) without abnormal findings: Secondary | ICD-10-CM | POA: Diagnosis not present

## 2022-05-13 DIAGNOSIS — Z1231 Encounter for screening mammogram for malignant neoplasm of breast: Secondary | ICD-10-CM

## 2022-05-13 DIAGNOSIS — Z1211 Encounter for screening for malignant neoplasm of colon: Secondary | ICD-10-CM

## 2022-05-13 NOTE — Patient Instructions (Signed)
I value your feedback and you entrusting us with your care. If you get a [place] patient survey, I would appreciate you taking the time to let us know about your experience today. Thank you!  [facility]: [phone]      

## 2022-05-15 LAB — CYTOLOGY - PAP: Diagnosis: NEGATIVE

## 2022-06-06 ENCOUNTER — Ambulatory Visit
Admission: RE | Admit: 2022-06-06 | Discharge: 2022-06-06 | Disposition: A | Discharge status: Home / Self Care | Payer: Medicare PPO | Source: Ambulatory Visit | Attending: Obstetrics and Gynecology | Admitting: Obstetrics and Gynecology

## 2022-06-06 DIAGNOSIS — Z1231 Encounter for screening mammogram for malignant neoplasm of breast: Secondary | ICD-10-CM | POA: Insufficient documentation

## 2022-08-05 IMAGING — MG MM DIGITAL SCREENING BILAT W/ TOMO AND CAD
8 series · 9 of 24 positions shown · non-contrast
Comparison: Previous exam(s).

CLINICAL DATA: Screening.

EXAM:
DIGITAL SCREENING BILATERAL MAMMOGRAM WITH TOMOSYNTHESIS AND CAD
TECHNIQUE: Bilateral screening digital craniocaudal and mediolateral oblique
mammograms were obtained. Bilateral screening digital breast
tomosynthesis was performed. The images were evaluated with
computer-aided detection.

[L MLO synth-2D]
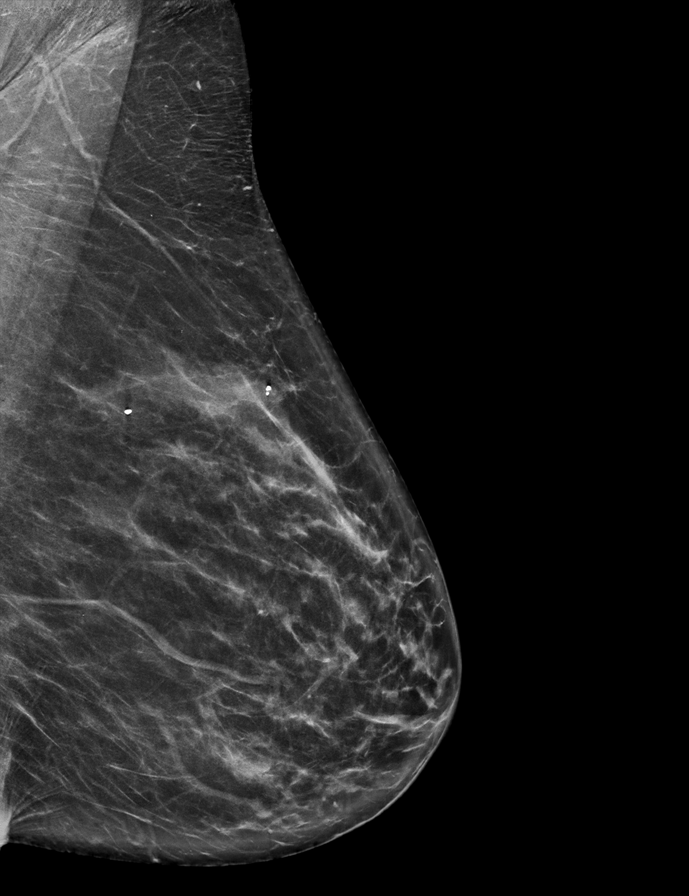

[R MLO synth-2D]
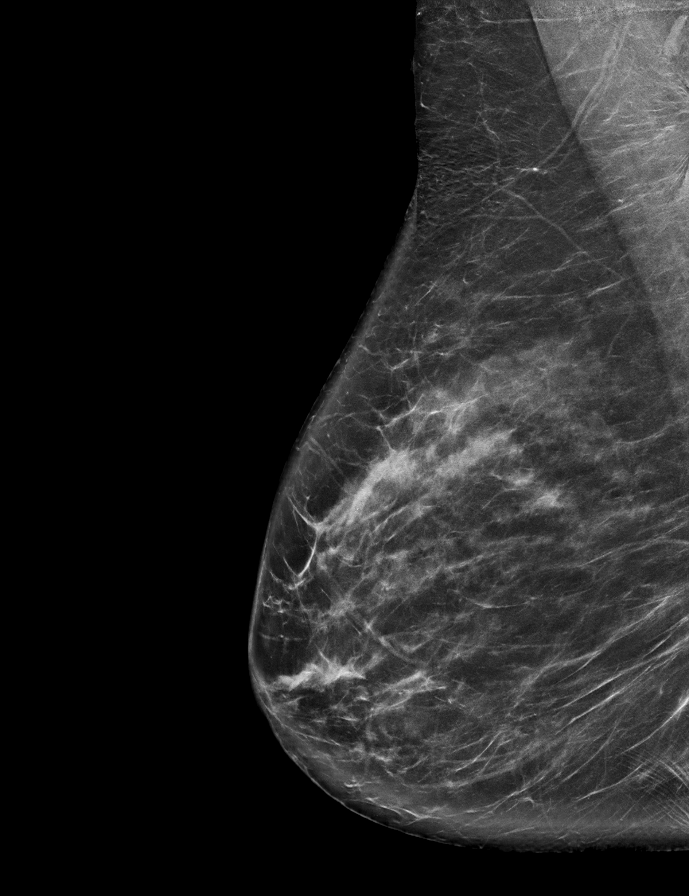

[L CC synth-2D]
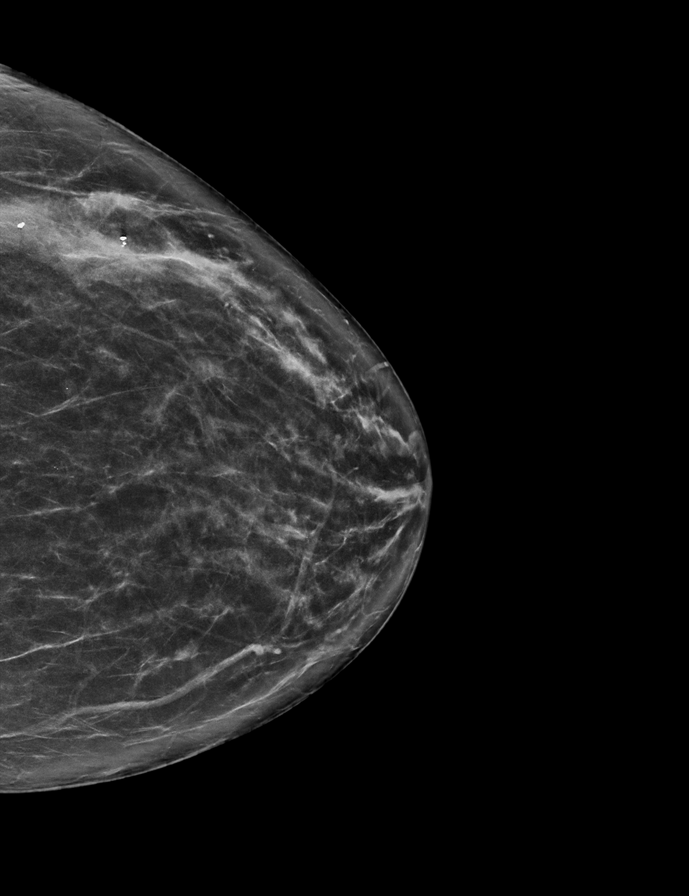

[R CC synth-2D]
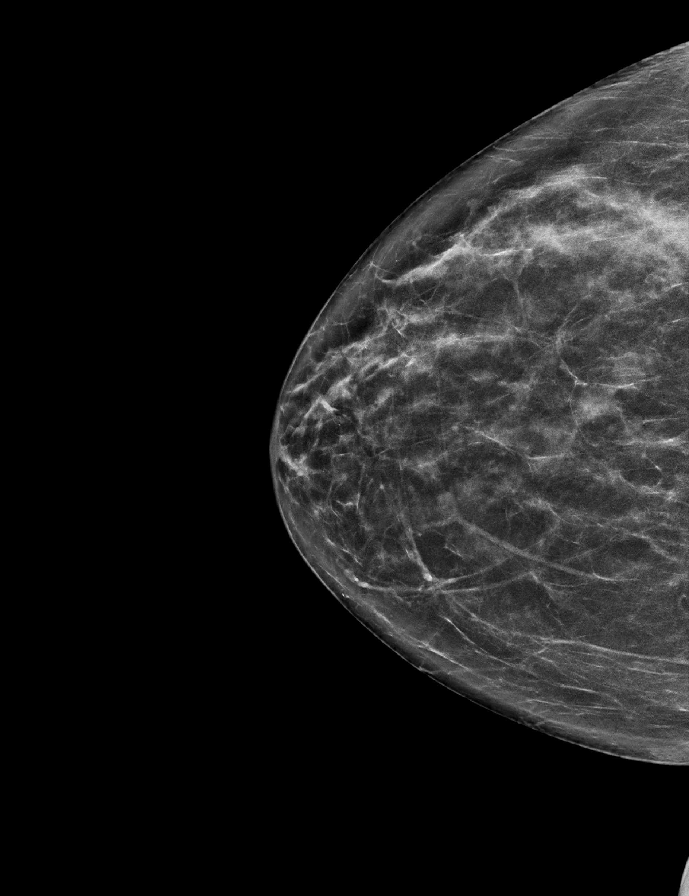

[L MLO tomo · 2 of 68 frames shown]
[frame 22/68]
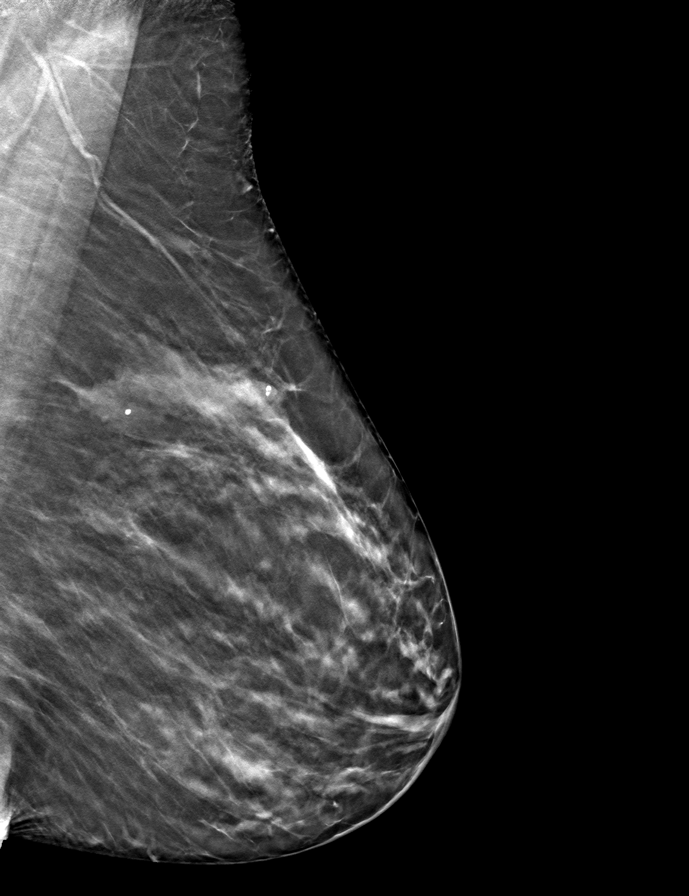
[frame 35/68]
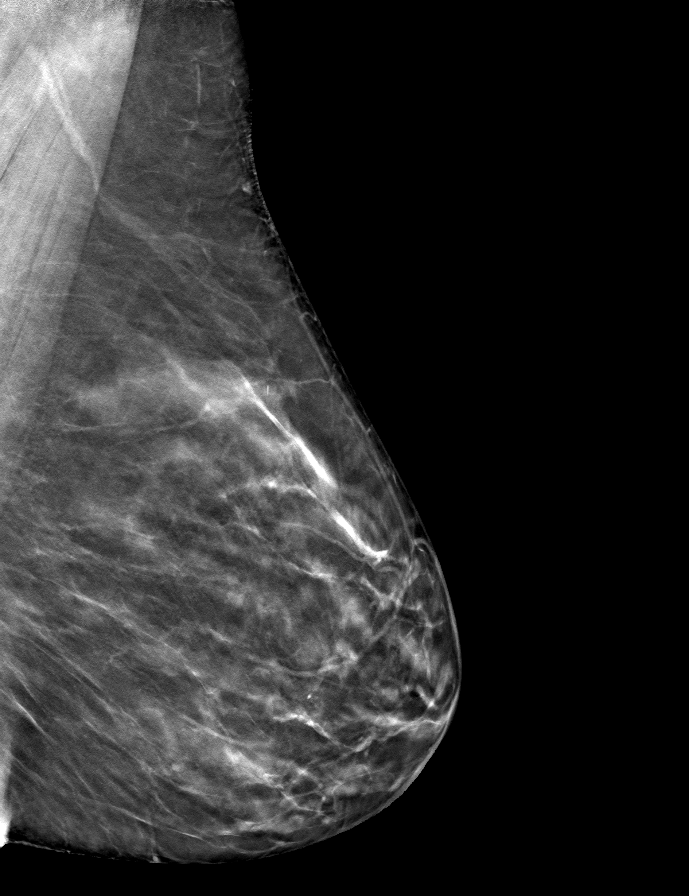

[R MLO tomo · tomo slice 39/78.0]
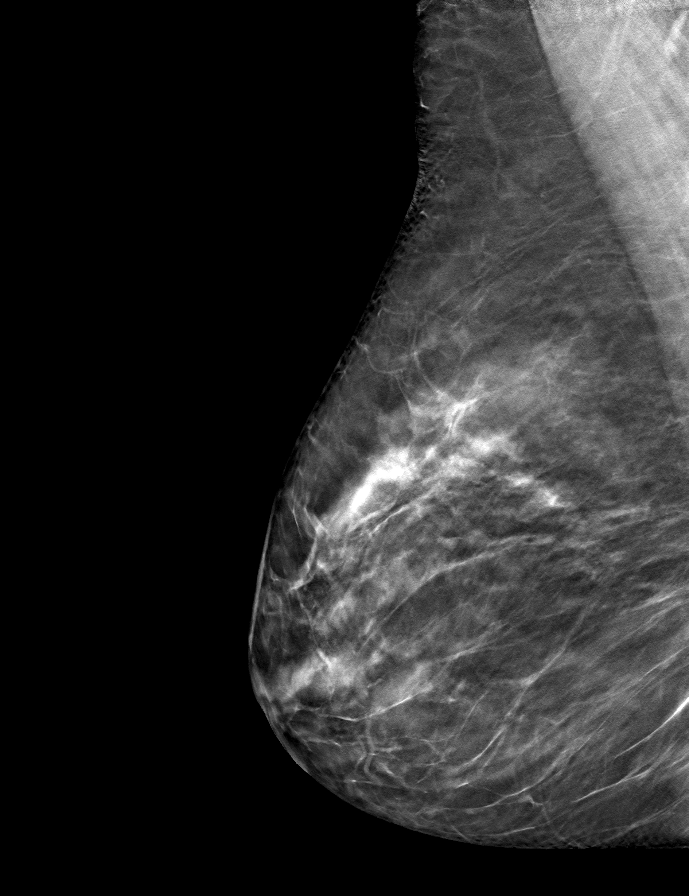

[R CC tomo · tomo slice 32/63.0]
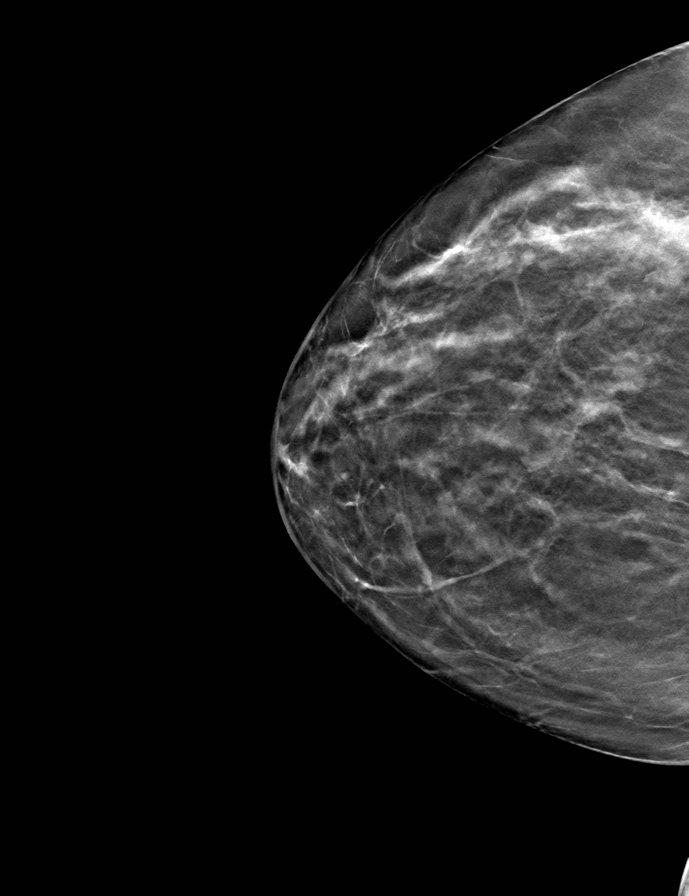

[L CC tomo · tomo slice 33/65.0]
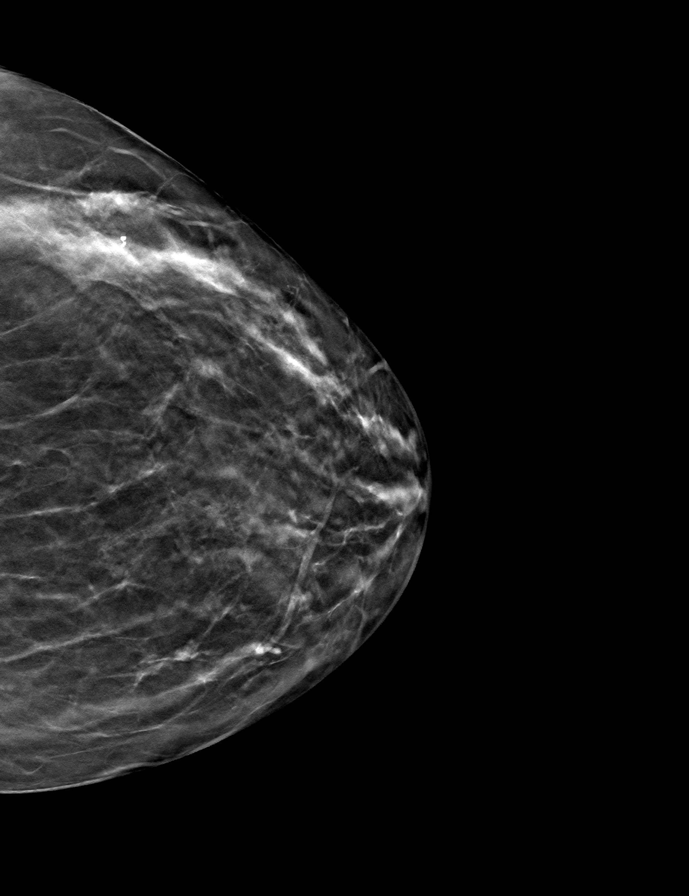

[9 of 24 positions shown; findings below may reference images not displayed]

ACR Breast Density Category b: There are scattered areas of
fibroglandular density.
FINDINGS: There are no findings suspicious for malignancy.
IMPRESSION: No mammographic evidence of malignancy. A result letter of this
screening mammogram will be mailed directly to the patient.

RECOMMENDATION:
Screening mammogram in one year. (Code:51-O-LD2)

BI-RADS CATEGORY  1: Negative.

## 2023-05-06 ENCOUNTER — Other Ambulatory Visit: Payer: Self-pay | Admitting: Student

## 2023-05-06 ENCOUNTER — Ambulatory Visit
Admission: RE | Admit: 2023-05-06 | Discharge: 2023-05-06 | Disposition: A | Discharge status: Home / Self Care | Payer: Medicare PPO | Source: Ambulatory Visit | Attending: Student

## 2023-05-06 DIAGNOSIS — R059 Cough, unspecified: Secondary | ICD-10-CM

## 2023-05-14 NOTE — Progress Notes (Signed)
 PCP: Rica Records, PA-C   Chief Complaint  Patient presents with   Gynecologic Exam    No concerns    HPI:      [name] is a 68 y.o. No obstetric history on file. who LMP was No LMP recorded. Patient is postmenopausal., presents today for her MEDICARE annual examination.  Her menses are absent due to menopause. She does not have PMB. She does not have vasomotor sx.   Sex activity: rarely; contraception - post menopausal status. She does have vaginal dryness and uses vag supp lubricant with sx relief.   Last Pap: 05/13/22  Results were: no abnormalities /neg HPV DNA.  Hx of STDs: none  Last mammogram: 06/06/22 Results were: normal--routine follow-up in 12 months There is no FH of breast cancer. There is no FH of ovarian cancer. The patient does self-breast exams.   Colonoscopy: colonoscopy 8/23 at [facility];  Repeat due after 10 years per pt. FH colon cancer in aunt but doesn't qualify for cancer genetic testing.   Tobacco use: The patient denies current or previous tobacco use. Alcohol use: none  No drug use Exercise: moderately active  DEXA: 12/23 at [facility] was normal; 12/21 with osteopenia in spine/normal hip, doing fosamax with PCP.  She does get adequate calcium and Vitamin D in her diet.  Labs with PCP.  Past Medical History:  Diagnosis Date   Anemia    Basal cell carcinoma 2014   scalp    Past Surgical History:  Procedure Laterality Date   BASAL CELL CARCINOMA EXCISION  2014   scalp   CESAREAN SECTION  08/20/1983    Family History  Problem Relation Age of Onset   Skin cancer Mother    Leukemia Father    Colon cancer Maternal Grandfather        "older"   Colon cancer Maternal Aunt        92s   Breast cancer Neg Hx     Social History   Socioeconomic History   Marital status: Married    Spouse name: Not on file   Number of children: Not on file   Years of education: Not on file   Highest education level: Not on file  Occupational  History   Not on file  Tobacco Use   Smoking status: Never   Smokeless tobacco: Never  Vaping Use   Vaping status: Never Used  Substance and Sexual Activity   Alcohol use: No   Drug use: No   Sexual activity: Yes    Birth control/protection: Post-menopausal  Other Topics Concern   Not on file  Social History Narrative   Not on file   Social Drivers of Health   Financial Resource Strain: Low Risk  (02/16/2023)   Received from [facility]   Overall Financial Resource Strain (CARDIA)    Difficulty of Paying Living Expenses: Not hard at all  Food Insecurity: No Food Insecurity (02/16/2023)   Received from [facility]   Hunger Vital Sign    Worried About Running Out of Food in the Last Year: Never true    Ran Out of Food in the Last Year: Never true  Transportation Needs: No Transportation Needs (02/16/2023)   Received from [facility] - Transportation    In the past 12 months, has lack of transportation kept you from medical appointments or from getting medications?: No    Lack of Transportation (Non-Medical): No  Physical Activity: Not on file  Stress: Not on file  Social Connections: Not on file  Intimate Partner Violence: Not on file    Current Meds  Medication Sig   alendronate (FOSAMAX) 70 MG tablet Take by mouth.   Calcium Carbonate-Vitamin D 600-400 MG-UNIT tablet Take by mouth.   ibuprofen (ADVIL,MOTRIN) 200 MG tablet Take by mouth.   Red Yeast Rice Extract (RED YEAST RICE PO) Take 1 tablet by mouth 2 (two) times daily.      ROS:  Review of Systems  Constitutional:  Negative for fatigue, fever and unexpected weight change.  Respiratory:  Negative for cough, shortness of breath and wheezing.   Cardiovascular:  Negative for chest pain, palpitations and leg swelling.  Gastrointestinal:  Positive for diarrhea. Negative for blood in stool, constipation, nausea and vomiting.  Endocrine: Negative for  cold intolerance, heat intolerance and polyuria.  Genitourinary:  Negative for dyspareunia, dysuria, flank pain, frequency, genital sores, hematuria, menstrual problem, pelvic pain, urgency, vaginal bleeding, vaginal discharge and vaginal pain.  Musculoskeletal:  Negative for back pain, joint swelling and myalgias.  Skin:  Negative for rash.  Neurological:  Negative for dizziness, syncope, light-headedness, numbness and headaches.  Hematological:  Negative for adenopathy.  Psychiatric/Behavioral:  Negative for agitation, confusion, sleep disturbance and suicidal ideas. The patient is not nervous/anxious.   BREAST: no tenderness   Objective: BP (!) 151/74   Pulse 66   Ht 5\' 7"  (1.702 m)   Wt 154 lb (69.9 kg)   BMI 24.12 kg/m    Physical Exam Constitutional:      Appearance: She is well-developed.  Genitourinary:     Vulva normal.     Right Labia: No rash, tenderness or lesions.    Left Labia: No tenderness, lesions or rash.    No vaginal discharge, erythema or tenderness.     Moderate vaginal atrophy present.     Right Adnexa: not tender and no mass present.    Left Adnexa: not tender and no mass present.    No cervical motion tenderness, friability or polyp.     Uterus is not enlarged or tender.  Breasts:    Right: No mass, nipple discharge, skin change or tenderness.     Left: No mass, nipple discharge, skin change or tenderness.  Neck:     Thyroid: No thyromegaly.  Cardiovascular:     Rate and Rhythm: Normal rate and regular rhythm.     Heart sounds: Normal heart sounds. No murmur heard. Pulmonary:     Effort: Pulmonary effort is normal.     Breath sounds: Normal breath sounds.  Abdominal:     Palpations: Abdomen is soft.     Tenderness: There is no abdominal tenderness. There is no guarding or rebound.  Musculoskeletal:        General: Normal range of motion.     Cervical back: Normal range of motion.  Lymphadenopathy:     Cervical: No cervical adenopathy.   Neurological:     General: No focal deficit present.     Mental Status: She is alert and oriented to person, place, and time.     Cranial Nerves: No cranial nerve deficit.  Skin:    General: Skin is warm and dry.  Psychiatric:        Mood and Affect: Mood normal.        Behavior: Behavior normal.        Thought Content: Thought content normal.        Judgment: Judgment  normal.  Vitals reviewed.     Assessment/Plan: Encounter for annual routine gynecological examination  Encounter for screening mammogram for malignant neoplasm of breast - Plan: MM 3D SCREENING MAMMOGRAM BILATERAL BREAST; pt to schedule mammo         GYN counsel breast self exam, mammography screening, menopause, adequate intake of calcium and vitamin D, diet and exercise    F/U  Return in about 2 years (around 05/24/2025) for annual.  Joyia Riehle B. Gavynn Duvall, PA-C 05/25/2023 8:36 AM

## 2023-05-25 ENCOUNTER — Ambulatory Visit (INDEPENDENT_AMBULATORY_CARE_PROVIDER_SITE_OTHER): Payer: Medicare PPO | Admitting: Obstetrics and Gynecology

## 2023-05-25 ENCOUNTER — Encounter: Payer: Self-pay | Admitting: Obstetrics and Gynecology

## 2023-05-25 VITALS — BP 154/72 | HR 67 | Ht 67.0 in | Wt 154.0 lb

## 2023-05-25 DIAGNOSIS — Z01419 Encounter for gynecological examination (general) (routine) without abnormal findings: Secondary | ICD-10-CM | POA: Diagnosis not present

## 2023-05-25 DIAGNOSIS — Z1231 Encounter for screening mammogram for malignant neoplasm of breast: Secondary | ICD-10-CM

## 2023-05-25 NOTE — Patient Instructions (Signed)
 I value your feedback and you entrusting [place] with your care. If you get a [facility] patient survey, I would appreciate you taking the time to let us know about your experience today. Thank you!  [facility] ([place])--[phone]

## 2023-06-09 ENCOUNTER — Ambulatory Visit
Admission: RE | Admit: 2023-06-09 | Discharge: 2023-06-09 | Disposition: A | Discharge status: Home / Self Care | Source: Ambulatory Visit | Attending: Obstetrics and Gynecology | Admitting: Obstetrics and Gynecology

## 2023-06-09 DIAGNOSIS — Z1231 Encounter for screening mammogram for malignant neoplasm of breast: Secondary | ICD-10-CM | POA: Diagnosis present

## 2023-06-11 ENCOUNTER — Encounter: Payer: Self-pay | Admitting: Obstetrics and Gynecology
